# Patient Record
Sex: Male | Born: 1955 | Race: Black or African American | Hispanic: No | Marital: Single | Smoking: Current some day smoker
Health system: Southern US, Community
[De-identification: ages and names within clinical notes are randomized; demographics above are authoritative.]

## PROBLEM LIST (undated history)

## (undated) DIAGNOSIS — I10 Essential (primary) hypertension: Secondary | ICD-10-CM

---

## 2013-11-13 LAB — CK W/ CKMB & INDEX
CK - MB: 3.9 NG/ML — ABNORMAL HIGH (ref 0.5–3.6)
CK-MB Index: 1.4 (ref 0–2.5)
CK: 269 U/L (ref 39–308)

## 2013-11-13 LAB — METABOLIC PANEL, COMPREHENSIVE
A-G Ratio: 0.9 — ABNORMAL LOW (ref 1.1–2.2)
ALT (SGPT): 37 U/L (ref 12–78)
AST (SGOT): 20 U/L (ref 15–37)
Albumin: 3.7 g/dL (ref 3.5–5.0)
Alk. phosphatase: 103 U/L (ref 45–117)
Anion gap: 8 mmol/L (ref 5–15)
BUN/Creatinine ratio: 13 (ref 12–20)
BUN: 19 MG/DL (ref 6–20)
Bilirubin, total: 0.4 MG/DL (ref 0.2–1.0)
CO2: 27 mmol/L (ref 21–32)
Calcium: 8.8 MG/DL (ref 8.5–10.1)
Chloride: 106 mmol/L (ref 97–108)
Creatinine: 1.42 MG/DL — ABNORMAL HIGH (ref 0.45–1.15)
GFR est AA: >60 mL/min/{1.73_m2} (ref >60)
GFR est non-AA: 51 mL/min/{1.73_m2} — ABNORMAL LOW (ref >60)
Globulin: 4 g/dL (ref 2.0–4.0)
Glucose: 97 mg/dL (ref 65–100)
Potassium: 3.9 mmol/L (ref 3.5–5.1)
Protein, total: 7.7 g/dL (ref 6.4–8.2)
Sodium: 141 mmol/L (ref 136–145)

## 2013-11-13 LAB — EKG, 12 LEAD, INITIAL
Atrial Rate: 70 {beats}/min
Calculated P Axis: 49 degrees
Calculated R Axis: -7 degrees
Calculated T Axis: 114 degrees
P-R Interval: 166 ms
Q-T Interval: 412 ms
QRS Duration: 84 ms
QTC Calculation (Bezet): 444 ms
Ventricular Rate: 70 {beats}/min

## 2013-11-13 LAB — CBC WITH AUTOMATED DIFF
ABS. BASOPHILS: 0 10*3/uL (ref 0.0–0.1)
ABS. EOSINOPHILS: 0.3 10*3/uL (ref 0.0–0.4)
ABS. LYMPHOCYTES: 2.1 10*3/uL (ref 0.8–3.5)
ABS. MONOCYTES: 0.6 10*3/uL (ref 0.0–1.0)
ABS. NEUTROPHILS: 4 10*3/uL (ref 1.8–8.0)
BASOPHILS: 1 % (ref 0–1)
EOSINOPHILS: 5 % (ref 0–7)
HCT: 46 % (ref 36.6–50.3)
HGB: 14.9 g/dL (ref 12.1–17.0)
LYMPHOCYTES: 30 % (ref 12–49)
MCH: 27.7 PG (ref 26.0–34.0)
MCHC: 32.4 g/dL (ref 30.0–36.5)
MCV: 85.7 FL (ref 80.0–99.0)
MONOCYTES: 9 % (ref 5–13)
NEUTROPHILS: 55 % (ref 32–75)
PLATELET: 266 10*3/uL (ref 150–400)
RBC: 5.37 M/uL (ref 4.10–5.70)
RDW: 13.5 % (ref 11.5–14.5)
WBC: 7.1 10*3/uL (ref 4.1–11.1)

## 2013-11-13 LAB — TROPONIN I
Troponin-I, Qt.: <0.04 ng/mL (ref <0.05)
Troponin-I, Qt.: <0.04 ng/mL (ref <0.05)

## 2013-11-13 LAB — CK W/ REFLX CKMB: CK: 279 U/L (ref 39–308)

## 2013-11-13 MED ORDER — MORPHINE 2 MG/ML INJECTION
2 mg/mL | INTRAMUSCULAR | Status: AC
Start: 2013-11-13 — End: 2013-11-13
  Administered 2013-11-13: 22:00:00 via INTRAVENOUS

## 2013-11-13 MED ORDER — MUPIROCIN 2 % OINTMENT
2 % | Freq: Two times a day (BID) | CUTANEOUS | Status: AC
Start: 2013-11-13 — End: ?

## 2013-11-13 MED ORDER — ONDANSETRON (PF) 4 MG/2 ML INJECTION
4 mg/2 mL | INTRAMUSCULAR | Status: AC
Start: 2013-11-13 — End: 2013-11-13
  Administered 2013-11-13: 22:00:00 via INTRAVENOUS

## 2013-11-13 MED ORDER — HYDROCODONE-ACETAMINOPHEN 5 MG-325 MG TAB
5-325 mg | ORAL_TABLET | ORAL | Status: AC | PRN
Start: 2013-11-13 — End: ?

## 2013-11-13 MED FILL — MORPHINE 2 MG/ML INJECTION: 2 mg/mL | INTRAMUSCULAR | Qty: 2

## 2013-11-13 MED FILL — ONDANSETRON (PF) 4 MG/2 ML INJECTION: 4 mg/2 mL | INTRAMUSCULAR | Qty: 2

## 2013-11-13 NOTE — ED Provider Notes (Signed)
HPI Comments: Alvin Knight is a 58 y.o. male who presents ambulatory with police to [facility] ED with cc of current 6/10 L sided CP which radiates to his L arm, neck and lower back x 2-3 weeks with minimal intermittent SOB and minimal diaphoresis and additional sxs of cough which is minimally productive of yellow phlegm x 3 weeks, and "burning" rash on his R forearm. Papers note that pt was evaluated in [facility] on 11/07/13 for similar sxs and left AMA. Pt notes that at that time he was told he had an abnormal heart rate and an abnormal EKG. Pt states he is unsure if he burned or scraped his R forearm. Pt notes most recent stress test in 2010 which was normal. Pt denies hx of cardiac cath. Pt denies nausea, fever, leg pain, leg swelling or urinary sxs.    Allergies: NKDA    PCP: Ellwood Sayers    PMhx is significant for: HTN  PShx is significant for: Pt denies   Social hx: + Tobacco (former smoke), - EtOH, - Illicit Drugs    There are no other complaints, changes or physical findings at this time.  Written by Sylvan Cheese, ED Scribe, as dictated by Seward Speck, MD.        The history is provided by the patient. No language interpreter was used.        Past Medical History   Diagnosis Date   ??? Hypertension         History reviewed. No pertinent past surgical history.      History reviewed. No pertinent family history.     History     Social History   ??? Marital Status: SINGLE     Spouse Name: N/A     Number of Children: N/A   ??? Years of Education: N/A     Occupational History   ??? Not on file.     Social History Main Topics   ??? Smoking status: Former Smoker   ??? Smokeless tobacco: Not on file   ??? Alcohol Use: No   ??? Drug Use: No   ??? Sexual Activity: Not on file     Other Topics Concern   ??? Not on file     Social History Narrative   ??? No narrative on file                  ALLERGIES: Review of patient's allergies indicates no known allergies.      Review of Systems   Constitutional: Positive for  diaphoresis (minimal). Negative for fever.   HENT: Negative.    Eyes: Negative.    Respiratory: Positive for cough and shortness of breath (minimal, occasional).    Cardiovascular: Positive for chest pain (radiating to neck, L arm and lower back). Negative for leg swelling.   Gastrointestinal: Negative for nausea.   Endocrine: Negative for heat intolerance.   Genitourinary:        No urinary sxs   Musculoskeletal: Negative for arthralgias (no leg pain).   Skin: Positive for rash (R forearm).   Allergic/Immunologic: Negative for immunocompromised state.   Hematological: Does not bruise/bleed easily.   Psychiatric/Behavioral: Negative.    All other systems reviewed and are negative.      Filed Vitals:    11/13/13 1425 11/13/13 1730 11/13/13 1800   BP: 137/87 134/73 151/78   Pulse: 69     Temp: 98.6 ??F (37 ??C)     Resp: _0 Height:  5' 8" (1.727 m)     Weight: 113.399 kg (250 lb)     SpO2: 98% 95% 95%            Physical Exam   Constitutional: He is oriented to person, place, and time. He appears well-developed. He appears distressed (mild).   Obese.   HENT:   Head: Normocephalic and atraumatic.   Neck: Normal range of motion.   Lower cervical tenderness.   Cardiovascular: Normal rate and regular rhythm.    Pulmonary/Chest: Effort normal and breath sounds normal. He exhibits tenderness (some L sided chest wall tenderness).   Abdominal: Soft. Bowel sounds are normal. There is no tenderness.   Musculoskeletal: He exhibits no edema (in legs) or tenderness (in legs).   L flank tenderness.   Neurological: He is alert and oriented to person, place, and time. Coordination normal.   Skin: Skin is warm and dry. Rash (impetigo-like rash on R forearm, mildly tender) noted.   Psychiatric: He has a normal mood and affect.   Nursing note and vitals reviewed.  Written by Sylvan Cheese, ED Scribe, as dictated by Seward Speck, MD.    MDM  Number of Diagnoses or Management Options  Chest pain:   Impetigo:   Diagnosis management  comments: DDx: CAD, costochondritis, musculoskeletal pain, reflux, gastritis, dermatitis, impetigo        Amount and/or Complexity of Data Reviewed  Clinical lab tests: ordered and reviewed  Tests in the radiology section of CPT??: ordered and reviewed  Tests in the medicine section of CPT??: ordered and reviewed  Review and summarize past medical records: yes  Independent visualization of images, tracings, or specimens: yes    Patient Progress  Patient progress: stable      Procedures    EKG interpretation: (Preliminary)  14:25  Rhythm: sinus rhythm with marked sinus arrhythmia. Rate (approx.): 70 bpm; Axis: normal; P wave: possible L atrial enlargement; QRS interval: normal ; ST/T wave: T wave abnormality, consider lateral ischemia; Other findings: left ventricular hypertrophy, possible ischemia.  Written by Sylvan Cheese, ED Scribe, as dictated by Seward Speck, MD.    7:20 PM  Pt reevaluated. Pt denies any SOB and denies any SOB with deep inspiration.  Written by Sylvan Cheese, ED Scribe, as dictated by Seward Speck, MD.    LABORATORY TESTS:  Recent Results (from the past 12 hour(s))   EKG, 12 LEAD, INITIAL    Collection Time     11/13/13  2:25 PM       Result Value Ref Range    Ventricular Rate 70      Atrial Rate 70      P-R Interval 166      QRS Duration 84      Q-T Interval 412      QTC Calculation (Bezet) 444      Calculated P Axis 49      Calculated R Axis -7      Calculated T Axis 114      Diagnosis        Value: Sinus rhythm with marked sinus arrhythmia  Possible Left atrial enlargement  Left ventricular hypertrophy  Nonspecific ST and T wave abnormality    Abnormal ECG  No previous ECGs available  Confirmed by Comer Locket [id number]) on 11/13/2013 6:58:43 PM     CBC WITH AUTOMATED DIFF    Collection Time     11/13/13  2:30 PM       Result Value Ref Range    WBC  7.1  4.1 - 11.1 K/uL    RBC 5.37  4.10 - 5.70 M/uL    HGB 14.9  12.1 - 17.0 g/dL    HCT 46.0  36.6 - 50.3 %    MCV 85.7  80.0 - 99.0 FL     MCH 27.7  26.0 - 34.0 PG    MCHC 32.4  30.0 - 36.5 g/dL    RDW 13.5  11.5 - 14.5 %    PLATELET 266  150 - 400 K/uL    NEUTROPHILS 55  32 - 75 %    LYMPHOCYTES 30  12 - 49 %    MONOCYTES 9  5 - 13 %    EOSINOPHILS 5  0 - 7 %    BASOPHILS 1  0 - 1 %    ABS. NEUTROPHILS 4.0  1.8 - 8.0 K/UL    ABS. LYMPHOCYTES 2.1  0.8 - 3.5 K/UL    ABS. MONOCYTES 0.6  0.0 - 1.0 K/UL    ABS. EOSINOPHILS 0.3  0.0 - 0.4 K/UL    ABS. BASOPHILS 0.0  0.0 - 0.1 K/UL   METABOLIC PANEL, COMPREHENSIVE    Collection Time     11/13/13  2:30 PM       Result Value Ref Range    Sodium 141  136 - 145 mmol/L    Potassium 3.9  3.5 - 5.1 mmol/L    Chloride 106  97 - 108 mmol/L    CO2 27  21 - 32 mmol/L    Anion gap 8  5 - 15 mmol/L    Glucose 97  65 - 100 mg/dL    BUN 19  6 - 20 MG/DL    Creatinine 1.42 (*) 0.45 - 1.15 MG/DL    BUN/Creatinine ratio 13  12 - 20      GFR est AA >60  >60 ml/min/1.36m    GFR est non-AA 51 (*) >60 ml/min/1.732m   Calcium 8.8  8.5 - 10.1 MG/DL    Bilirubin, total 0.4  0.2 - 1.0 MG/DL    ALT 37  12 - 78 U/L    AST 20  15 - 37 U/L    Alk. phosphatase 103  45 - 117 U/L    Protein, total 7.7  6.4 - 8.2 g/dL    Albumin 3.7  3.5 - 5.0 g/dL    Globulin 4.0  2.0 - 4.0 g/dL    A-G Ratio 0.9 (*) 1.1 - 2.2     TROPONIN I    Collection Time     11/13/13  2:30 PM       Result Value Ref Range    Troponin-I, Qt. <0.04  <0.05 ng/mL   CK W/ REFLX CKMB    Collection Time     11/13/13  2:30 PM       Result Value Ref Range    CK 279  39 - 308 U/L   CK W/ CKMB & INDEX    Collection Time     11/13/13  6:16 PM       Result Value Ref Range    CK 269  39 - 308 U/L    CK - MB 3.9 (*) 0.5 - 3.6 NG/ML    CK-MB Index 1.4  0 - 2.5     TROPONIN I    Collection Time     11/13/13  6:16 PM       Result Value Ref Range    Troponin-I, Qt. <0.04  <  0.05 ng/mL       IMAGING RESULTS:     XR CHEST PA LAT (Final result) Result time: 11/13/13 15:51:59    Final result by Rad Results In Edi (11/13/13 15:51:59)    Narrative:    **Final Report**      ICD Codes /  Adm.Diagnosis: 407680 / Chest Pain Chest Pain  Examination: CR CHEST PA AND LATERAL - [id number] - Nov 13 2013 3:48PM  Accession No: [id number]  Reason: Chest Pain      REPORT:  Clinical indication: Chest pain.    Frontal and lateral views of the chest obtained. The heart size is normal.   There is no acute infiltrate.      IMPRESSION: Negative.          Signing/Reading Doctor: Lannette Donath [phone])   Approved: Lannette Donath [phone]) Nov 13 2013 3:49PM                   XR CHEST PA LAT (Final result) Result time: 11/13/13 14:53:04    Final result by Rad Results In Edi (11/13/13 14:53:04)    Narrative:    **Final Report**      ICD Codes / Adm.Diagnosis: 711657 / Chest Pain Chest Pain  Examination: CR CHEST PA AND LATERAL - [id number] - Nov 13 2013 2:48PM  Accession No: [id number]  Reason: chest pain      REPORT:  Indication: Chest Pain     Exam: PA and lateral views of the chest.    There is no prior study for direct comparison.    Findings: Cardiomediastinal silhouette is within normal limits. Lungs are   clear bilaterally. Pleural spaces are normal. Osseous structures are intact.      IMPRESSION: No acute cardiopulmonary disease.          Signing/Reading Doctor: TODD B. BAIRD [phone])   Approved: TODD B. BAIRD [phone]) Nov 13 2013 2:50PM                MEDICATIONS GIVEN:  Medications   morphine injection 4 mg (4 mg IntraVENous Given 11/13/13 1812)   ondansetron (ZOFRAN) injection 4 mg (4 mg IntraVENous Given 11/13/13 1812)       IMPRESSION:  1. Chest pain    2. Impetigo        PLAN:  1. See a cardiologist for further evaluation.  2. FU with PCP in 2 days as needed.  3. Rx'd Bactroban and Norco  Return to ED if worse     Discharge Note:  7:21 PM  Pts results have been reviewed with them. Pt and/or family verbally conveys agreement and understanding of pt's signs, sxs, tx, dx, and prognosis and agrees to FU as recommended with PCP in 2 days as needed, to see a cardiologist for further evaluation or return to the ED should sxs  change prior to FU appointment. Pt and/or family verbally agrees with care plan and verbally conveys that all of their questions have been answered. Pt was discharged with prescriptions for Bactroban and Norco.  Written by Sylvan Cheese, ED Scribe, as dictated by Seward Speck, MD.

## 2013-11-13 NOTE — ED Notes (Signed)
Skin: Pt has rash to R forearm at this time. Area is oozing serous fluid.

## 2013-11-13 NOTE — ED Notes (Signed)
[place] [facility] [place] [facility] [place] hand and foot cuffs.  Pt placed in hallway outside of triage at this time.

## 2013-11-13 NOTE — ED Notes (Signed)
[place] [employer]. Pt's ankles are shackled.     Assumed care of pt. Pt states he was seen in [place] for chest pain on 3/4 and left AMA. Pt then reported to court on 3/5 and was sent to jail. Pt states chest pain ever since the 4th of March. Pt denies nausea, vomiting, and SOB. Pt is on monitor X2 and call bell is within reach.

## 2014-06-05 ENCOUNTER — Emergency Department: Payer: Self-pay | Admitting: Internal Medicine

## 2014-06-05 LAB — URINALYSIS, COMPLETE
BACTERIA: NONE SEEN
BLOOD: NEGATIVE
Bilirubin,UR: NEGATIVE
GLUCOSE, UR: NEGATIVE mg/dL (ref 0–75)
KETONE: NEGATIVE
Leukocyte Esterase: NEGATIVE
Nitrite: NEGATIVE
Ph: 5 (ref 4.5–8.0)
Protein: NEGATIVE
RBC,UR: NONE SEEN /HPF (ref 0–5)
Specific Gravity: 1.019 (ref 1.003–1.030)
Squamous Epithelial: <1
WBC UR: 1 /HPF (ref 0–5)

## 2014-06-05 LAB — CBC
HCT: 39.5 % — ABNORMAL LOW (ref 40.0–52.0)
HGB: 12.7 g/dL — AB (ref 13.0–18.0)
MCH: 28.5 pg (ref 26.0–34.0)
MCHC: 32.1 g/dL (ref 32.0–36.0)
MCV: 89 fL (ref 80–100)
Platelet: 203 10*3/uL (ref 150–440)
RBC: 4.45 10*6/uL (ref 4.40–5.90)
RDW: 14 % (ref 11.5–14.5)
WBC: 7.2 10*3/uL (ref 3.8–10.6)

## 2014-06-05 LAB — COMPREHENSIVE METABOLIC PANEL
ALK PHOS: 79 U/L
ALT: 33 U/L
Albumin: 3.4 g/dL (ref 3.4–5.0)
Anion Gap: 5 — ABNORMAL LOW (ref 7–16)
BUN: 14 mg/dL (ref 7–18)
Bilirubin,Total: 0.2 mg/dL (ref 0.2–1.0)
CHLORIDE: 108 mmol/L — AB (ref 98–107)
CREATININE: 1.37 mg/dL — AB (ref 0.60–1.30)
Calcium, Total: 8.1 mg/dL — ABNORMAL LOW (ref 8.5–10.1)
Co2: 29 mmol/L (ref 21–32)
EGFR (African American): >60
EGFR (Non-African Amer.): 57 — ABNORMAL LOW
Glucose: 82 mg/dL (ref 65–99)
OSMOLALITY: 283 (ref 275–301)
Potassium: 3.8 mmol/L (ref 3.5–5.1)
SGOT(AST): 25 U/L (ref 15–37)
Sodium: 142 mmol/L (ref 136–145)
Total Protein: 6.9 g/dL (ref 6.4–8.2)

## 2014-06-05 LAB — DRUG SCREEN, URINE
AMPHETAMINES, UR SCREEN: NEGATIVE (ref ?–1000)
BARBITURATES, UR SCREEN: NEGATIVE (ref ?–200)
BENZODIAZEPINE, UR SCRN: NEGATIVE (ref ?–200)
CANNABINOID 50 NG, UR ~~LOC~~: NEGATIVE (ref ?–50)
Cocaine Metabolite,Ur ~~LOC~~: NEGATIVE (ref ?–300)
MDMA (ECSTASY) UR SCREEN: NEGATIVE (ref ?–500)
Methadone, Ur Screen: NEGATIVE (ref ?–300)
Opiate, Ur Screen: NEGATIVE (ref ?–300)
Phencyclidine (PCP) Ur S: NEGATIVE (ref ?–25)
Tricyclic, Ur Screen: NEGATIVE (ref ?–1000)

## 2014-06-05 LAB — TROPONIN I
Troponin-I: <0.02 ng/mL
Troponin-I: <0.02 ng/mL

## 2014-10-27 ENCOUNTER — Emergency Department: Payer: Self-pay | Admitting: Emergency Medicine

## 2015-04-30 ENCOUNTER — Emergency Department
Admission: EM | Admit: 2015-04-30 | Discharge: 2015-04-30 | Disposition: A | Discharge status: Home / Self Care | Payer: Self-pay | Attending: Emergency Medicine | Admitting: Emergency Medicine

## 2015-04-30 ENCOUNTER — Encounter: Payer: Self-pay | Admitting: Emergency Medicine

## 2015-04-30 DIAGNOSIS — M545 Low back pain, unspecified: Secondary | ICD-10-CM

## 2015-04-30 DIAGNOSIS — Z720 Tobacco use: Secondary | ICD-10-CM | POA: Insufficient documentation

## 2015-04-30 DIAGNOSIS — I10 Essential (primary) hypertension: Secondary | ICD-10-CM | POA: Insufficient documentation

## 2015-04-30 HISTORY — DX: Essential (primary) hypertension: I10

## 2015-04-30 LAB — CBC WITH DIFFERENTIAL/PLATELET
Basophils Absolute: 0.1 10*3/uL (ref 0–0.1)
Basophils Relative: 1 %
Eosinophils Absolute: 0.2 10*3/uL (ref 0–0.7)
Eosinophils Relative: 4 %
HEMATOCRIT: 41.4 % (ref 40.0–52.0)
Hemoglobin: 13.5 g/dL (ref 13.0–18.0)
Lymphocytes Relative: 29 %
Lymphs Abs: 1.6 10*3/uL (ref 1.0–3.6)
MCH: 27.8 pg (ref 26.0–34.0)
MCHC: 32.6 g/dL (ref 32.0–36.0)
MCV: 85.3 fL (ref 80.0–100.0)
MONO ABS: 0.5 10*3/uL (ref 0.2–1.0)
MONOS PCT: 10 %
NEUTROS ABS: 3.2 10*3/uL (ref 1.4–6.5)
Neutrophils Relative %: 56 %
Platelets: 196 10*3/uL (ref 150–440)
RBC: 4.85 MIL/uL (ref 4.40–5.90)
RDW: 14.6 % — AB (ref 11.5–14.5)
WBC: 5.7 10*3/uL (ref 3.8–10.6)

## 2015-04-30 LAB — COMPREHENSIVE METABOLIC PANEL
ALBUMIN: 3.7 g/dL (ref 3.5–5.0)
ALT: 33 U/L (ref 17–63)
AST: 32 U/L (ref 15–41)
Alkaline Phosphatase: 72 U/L (ref 38–126)
Anion gap: 5 (ref 5–15)
BUN: 14 mg/dL (ref 6–20)
CO2: 29 mmol/L (ref 22–32)
CREATININE: 1.39 mg/dL — AB (ref 0.61–1.24)
Calcium: 8.7 mg/dL — ABNORMAL LOW (ref 8.9–10.3)
Chloride: 107 mmol/L (ref 101–111)
GFR calc Af Amer: >60 mL/min (ref >60)
GFR calc non Af Amer: 54 mL/min — ABNORMAL LOW (ref >60)
GLUCOSE: 99 mg/dL (ref 65–99)
Potassium: 3.7 mmol/L (ref 3.5–5.1)
SODIUM: 141 mmol/L (ref 135–145)
Total Bilirubin: 0.1 mg/dL — ABNORMAL LOW (ref 0.3–1.2)
Total Protein: 6.9 g/dL (ref 6.5–8.1)

## 2015-04-30 MED ORDER — HYDROCODONE-ACETAMINOPHEN 5-325 MG PO TABS: 1.0000 | ORAL_TABLET | ORAL | Status: AC | PRN

## 2015-04-30 MED ORDER — HYDROCHLOROTHIAZIDE 25 MG PO TABS: 25.0000 mg | ORAL_TABLET | Freq: Every day | ORAL | Status: AC

## 2015-04-30 MED ORDER — NAPROXEN 500 MG PO TABS: 500.0000 mg | ORAL_TABLET | Freq: Two times a day (BID) | ORAL | Status: AC

## 2015-04-30 MED ORDER — ATENOLOL 25 MG PO TABS: 25.0000 mg | ORAL_TABLET | Freq: Every day | ORAL | Status: AC

## 2015-04-30 NOTE — ED Provider Notes (Signed)
[facility] Emergency Department Provider Note  ____________________________________________  Time seen: Approximately 7:37 AM  I have reviewed the triage vital signs and the nursing notes.   HISTORY  Chief Complaint Hypertension   HPI Julian Robinson is a 59 y.o. male is here with complaint of low back pain for several days. He is also out of his blood pressure medicine. He states that he did not have an injury to his back and denies any previous back problems. States the pain is mostly on his left side, nonradiating. He denies any urinary symptoms. He states he also ran out of his blood pressure medicine approximately 2 weeks ago but is very evasive about the exact time. He states he takes atenolol 25 mg and hydrochlorothiazide 25 mg. He states he is not have a PCP and comes to the emergency room file his blood pressure refills. He states he did see a doctor in [place], [place] a couple of months ago but did not get the prescription.Currently his back pain is 8 out of 10. He denies any headache, vision changes, shortness of breath or chest pain.   Past Medical History  Diagnosis Date  . Hypertension     There are no active problems to display for this patient.   History reviewed. No pertinent past surgical history.  Current Outpatient Rx  Name  Route  Sig  Dispense  Refill  . atenolol (TENORMIN) 25 MG tablet   Oral   Take 1 tablet (25 mg total) by mouth daily.   30 tablet   11   . hydrochlorothiazide (HYDRODIURIL) 25 MG tablet   Oral   Take 1 tablet (25 mg total) by mouth daily.   30 tablet   0   . HYDROcodone-acetaminophen (NORCO/VICODIN) 5-325 MG per tablet   Oral   Take 1 tablet by mouth every 4 (four) hours as needed for moderate pain.   20 tablet   0   . naproxen (NAPROSYN) 500 MG tablet   Oral   Take 1 tablet (500 mg total) by mouth 2 (two) times daily with a meal.   30 tablet   0     Allergies Review of patient's  allergies indicates no known allergies.  History reviewed. No pertinent family history.  Social History Social History  Substance Use Topics  . Smoking status: Current Some Day Smoker  . Smokeless tobacco: None  . Alcohol Use: No    Review of Systems Constitutional: No fever/chills Eyes: No visual changes. ENT: No sore throat. Cardiovascular: Denies chest pain. Respiratory: Denies shortness of breath. Gastrointestinal: No abdominal pain.  No nausea, no vomiting. Genitourinary: Negative for dysuria. Musculoskeletal: Positive for back pain. Skin: Negative for rash. Neurological: Negative for headaches, focal weakness or numbness.  10-point ROS otherwise negative.  ____________________________________________   PHYSICAL EXAM:  VITAL SIGNS: ED Triage Vitals  Enc Vitals Group     BP 04/30/15 0716 171/98 mmHg     Pulse Rate 04/30/15 0716 71     Resp 04/30/15 0716 18     Temp 04/30/15 0716 97.7 F (36.5 C)     Temp Source 04/30/15 0716 Oral     SpO2 04/30/15 0716 98 %     Weight 04/30/15 0716 250 lb (113.399 kg)     Height 04/30/15 0716  (1.727 m)     Head Cir --      Peak Flow --      Pain Score 04/30/15 0715 8     Pain  Loc --      Pain Edu? --      Excl. in GC? --     Constitutional: Alert and oriented. Well appearing and in no acute distress. Eyes: Conjunctivae are normal. PERRL. EOMI. Head: Atraumatic. Nose: No congestion/rhinnorhea. Mouth/Throat: Mucous membranes are moist.  Oropharynx non-erythematous. Neck: No stridor.   Cardiovascular: Normal rate, regular rhythm. Grossly normal heart sounds.  Good peripheral circulation. Respiratory: Normal respiratory effort.  No retractions. Lungs CTAB. Gastrointestinal: Soft and nontender. No distention. No CVA tenderness. Musculoskeletal: Examination of the back. No gross deformity was noted. There is no tenderness on palpation of the LS spine. There is tenderness left lateral & muscle area. No difficulty with  range of motion. No active muscle spasms. Straight leg raises were negative. No lower extremity tenderness nor edema.  No joint effusions. Neurologic:  Normal speech and language. No gross focal neurologic deficits are appreciated. No gait instability. Reflexes 1+ bilaterally Skin:  Skin is warm, dry and intact. No rash noted. Psychiatric: Mood and affect are normal. Speech and behavior are normal.  ____________________________________________   LABS (all labs ordered are listed, but only abnormal results are displayed)  Labs Reviewed  COMPREHENSIVE METABOLIC PANEL - Abnormal; Notable for the following:    Creatinine, Ser 1.39 (*)    Calcium 8.7 (*)    Total Bilirubin 0.1 (*)    GFR calc non Af Amer 54 (*)    All other components within normal limits  CBC WITH DIFFERENTIAL/PLATELET - Abnormal; Notable for the following:    RDW 14.6 (*)    All other components within normal limits   ____________________________________________  PROCEDURES  Procedure(s) performed: None  Critical Care performed: No  ____________________________________________   INITIAL IMPRESSION / ASSESSMENT AND PLAN / ED COURSE  Pertinent labs & imaging results that were available during my care of the patient were reviewed by me and considered in my medical decision making (see chart for details).  Patient was instructed to get a PCP as soon as possible. He was given a list of clinics in this area that most fit his needs. He and his wife are aware of his creatinine being elevated at this time. We discussed the dangers of being on blood pressure medicine intermittently and not well controlled. Patient was started on Naprosyn 500 mg twice a day for his back pain. Norco for severe pain. Atenolol 25 mg one daily and hydrochlorothiazide 25 mg one daily. ____________________________________________   FINAL CLINICAL IMPRESSION(S) / ED DIAGNOSES  Final diagnoses:  Left-sided low back pain without sciatica   Essential hypertension      Tommi Rumps, PA-C 04/30/15 1019  Emily Filbert, MD 04/30/15 1525

## 2015-04-30 NOTE — Discharge Instructions (Signed)
CALL OFFICES LISTED ON YOUR PAPERS FOR APPOINTMENT TO CONTROL YOUR BLOOD PRESSURE

## 2015-04-30 NOTE — ED Notes (Signed)
States he developed lower back pain several days ago w/o injury. Ambulates well... States pain is non radiating. Also has been out of his b/p meds . Atenolol 25 mg and HCTZ   Last time he took them was about 2 weeks ago

## 2015-04-30 NOTE — ED Notes (Signed)
Reports lower back pain x 1 wk.  States he also ran out of his bp meds

## 2015-05-25 ENCOUNTER — Encounter: Payer: Self-pay | Admitting: Emergency Medicine

## 2015-05-25 ENCOUNTER — Emergency Department
Admission: EM | Admit: 2015-05-25 | Discharge: 2015-05-25 | Disposition: A | Discharge status: Home / Self Care | Payer: Self-pay | Attending: Emergency Medicine | Admitting: Emergency Medicine

## 2015-05-25 DIAGNOSIS — I10 Essential (primary) hypertension: Secondary | ICD-10-CM | POA: Insufficient documentation

## 2015-05-25 DIAGNOSIS — Z791 Long term (current) use of non-steroidal anti-inflammatories (NSAID): Secondary | ICD-10-CM | POA: Insufficient documentation

## 2015-05-25 DIAGNOSIS — Z760 Encounter for issue of repeat prescription: Secondary | ICD-10-CM | POA: Insufficient documentation

## 2015-05-25 DIAGNOSIS — Z720 Tobacco use: Secondary | ICD-10-CM | POA: Insufficient documentation

## 2015-05-25 DIAGNOSIS — Z79899 Other long term (current) drug therapy: Secondary | ICD-10-CM | POA: Insufficient documentation

## 2015-05-25 MED ORDER — HYDROCHLOROTHIAZIDE 12.5 MG PO TABS: 25.0000 mg | ORAL_TABLET | Freq: Every day | ORAL | Status: AC

## 2015-05-25 NOTE — Discharge Instructions (Signed)
Medication Refill, Emergency Department °We have refilled your medication today as a courtesy to you. It is best for your medical care, however, to take care of getting refills done through your primary caregiver's office. They have your records and can do a better job of follow-up than we can in the emergency department. °On maintenance medications, we often only prescribe enough medications to get you by until you are able to see your regular caregiver. This is a more expensive way to refill medications. °In the future, please plan for refills so that you will not have to use the emergency department for this. °Thank you for your help. Your help allows us to better take care of the daily emergencies that enter our department. °Document Released: 12/11/2003 Document Revised: 11/16/2011 Document Reviewed: 12/01/2013 °ExitCare® Patient Information ©2015 [employer]. This information is not intended to replace advice given to you by your health care provider. Make sure you discuss any questions you have with your health care provider. ° °

## 2015-05-25 NOTE — ED Notes (Signed)
States he needs b/p meds refilled  Has appt on the 28 th of this month.  Denies any sx's

## 2015-05-25 NOTE — ED Notes (Addendum)
Pt denies any complaints states he needs a medication refill of his blood pressure medication. Can not see PCP until the 28th of this month and only has 3 pills left. Mediations listed in chart

## 2015-05-25 NOTE — ED Provider Notes (Signed)
[facility] Emergency Department Provider Note  ____________________________________________  Time seen: Approximately 12:28 PM  I have reviewed the triage vital signs and the nursing notes.   HISTORY  Chief Complaint Medication Reaction    HPI Julian Robinson is a 59 y.o. male for refill of his hydrochlorothiazide. Patient state he is scheduled to see a new PCP on 06/05/19 16. Patient only has 3 tablets left from his previous prescription which was written on a 04/30/2015. Patient denies any headache or vision disturbance or vertigo.   Past Medical History  Diagnosis Date  . Hypertension     There are no active problems to display for this patient.   History reviewed. No pertinent past surgical history.  Current Outpatient Rx  Name  Route  Sig  Dispense  Refill  . atenolol (TENORMIN) 25 MG tablet   Oral   Take 1 tablet (25 mg total) by mouth daily.   30 tablet   11   . hydrochlorothiazide (HYDRODIURIL) 25 MG tablet   Oral   Take 1 tablet (25 mg total) by mouth daily.   30 tablet   0   . HYDROcodone-acetaminophen (NORCO/VICODIN) 5-325 MG per tablet   Oral   Take 1 tablet by mouth every 4 (four) hours as needed for moderate pain.   20 tablet   0   . naproxen (NAPROSYN) 500 MG tablet   Oral   Take 1 tablet (500 mg total) by mouth 2 (two) times daily with a meal.   30 tablet   0     Allergies Review of patient's allergies indicates no known allergies.  No family history on file.  Social History Social History  Substance Use Topics  . Smoking status: Current Some Day Smoker  . Smokeless tobacco: None  . Alcohol Use: No    Review of Systems Constitutional: No fever/chills Eyes: No visual changes. ENT: No sore throat. Cardiovascular: Denies chest pain. Respiratory: Denies shortness of breath. Gastrointestinal: No abdominal pain.  No nausea, no vomiting.  No diarrhea.  No constipation. Genitourinary: Negative for  dysuria. Musculoskeletal: Negative for back pain. Skin: Negative for rash. Neurological: Negative for headaches, focal weakness or numbness. Endocrine:Hypertension ___________________________________________   PHYSICAL EXAM:  VITAL SIGNS: ED Triage Vitals  Enc Vitals Group     BP 05/25/15 1146 138/69 mmHg     Pulse Rate 05/25/15 1146 63     Resp 05/25/15 1146 18     Temp 05/25/15 1146 97.7 F (36.5 C)     Temp Source 05/25/15 1146 Oral     SpO2 05/25/15 1146 96 %     Weight 05/25/15 1146 256 lb (116.121 kg)     Height 05/25/15 1146  (1.727 m)     Head Cir --      Peak Flow --      Pain Score --      Pain Loc --      Pain Edu? --      Excl. in GC? --     Constitutional: Alert and oriented. Well appearing and in no acute distress. Eyes: Conjunctivae are normal. PERRL. EOMI. Head: Atraumatic. Nose: No congestion/rhinnorhea. Mouth/Throat: Mucous membranes are moist.  Oropharynx non-erythematous. Neck: No stridor.   Hematological/Lymphatic/Immunilogical: No cervical lymphadenopathy. Cardiovascular: Normal rate, regular rhythm. Grossly normal heart sounds.  Good peripheral circulation. Respiratory: Normal respiratory effort.  No retractions. Lungs CTAB. Gastrointestinal: Soft and nontender. No distention. No abdominal bruits. No CVA tenderness. Musculoskeletal: No lower extremity tenderness nor edema.  No joint effusions. Neurologic:  Normal speech and language. No gross focal neurologic deficits are appreciated. No gait instability. Skin:  Skin is warm, dry and intact. No rash noted. Psychiatric: Mood and affect are normal. Speech and behavior are normal.  ____________________________________________   LABS (all labs ordered are listed, but only abnormal results are displayed)  Labs Reviewed - No data to  display ____________________________________________  EKG   ____________________________________________  RADIOLOGY   ____________________________________________   PROCEDURES  Procedure(s) performed: None  Critical Care performed: No  ____________________________________________   INITIAL IMPRESSION / ASSESSMENT AND PLAN / ED COURSE  Pertinent labs & imaging results that were available during my care of the patient were reviewed by me and considered in my medical decision making (see chart for details).  Medication refill. Patient advised to follow-up with scheduled appointment with new PCP.  ____________________________________________   FINAL CLINICAL IMPRESSION(S) / ED DIAGNOSES  Final diagnoses:  Encounter for medication refill      Joni Reining, PA-C 05/25/15 1238  Darien Ramus, MD 05/25/15 1415

## 2015-10-28 IMAGING — CT CT HEAD WITHOUT CONTRAST
1 series · 16 of 30 positions shown, 20 images · non-contrast
Comparison: None.

CLINICAL DATA: Blurred vision with elevated blood pressure for 2 or
3 days. Intermittent left chest pain.

EXAM:
CT HEAD WITHOUT CONTRAST
TECHNIQUE: Contiguous axial images were obtained from the base of the skull
through the vertex without intravenous contrast.

[Series 2: head wo · axial · 0.42mm/px · z∈[-144,-0]mm · 16 of 36 slices shown, 20 images]
[im 2/36  brain]
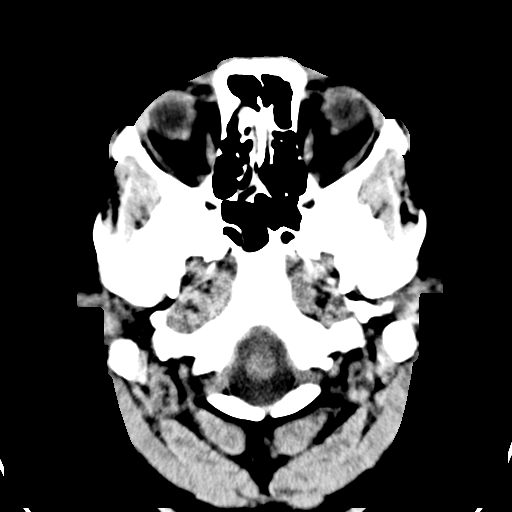
[im 2/36  bone]
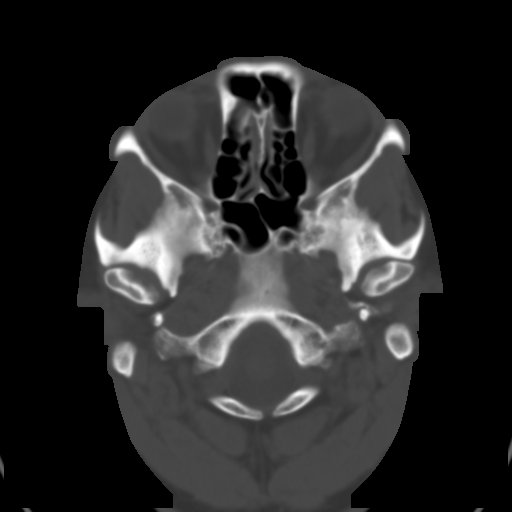
[im 4/36  brain]
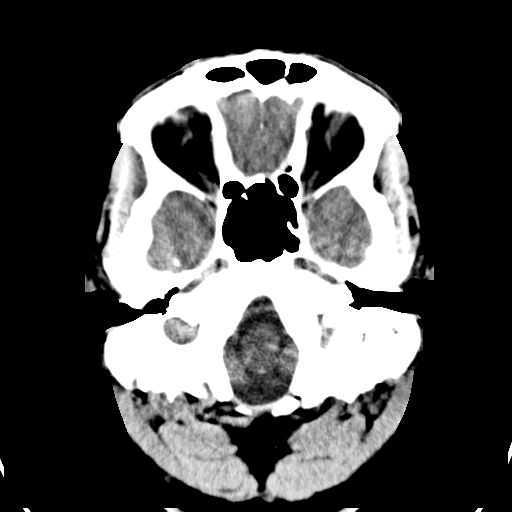
[im 7/36  brain]
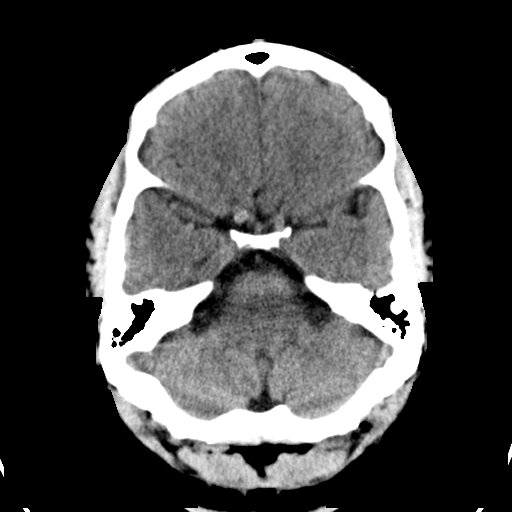
[im 9/36  brain]
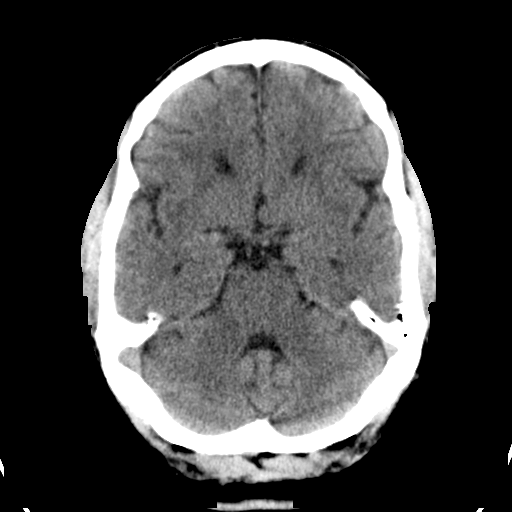
[im 10/36  brain]
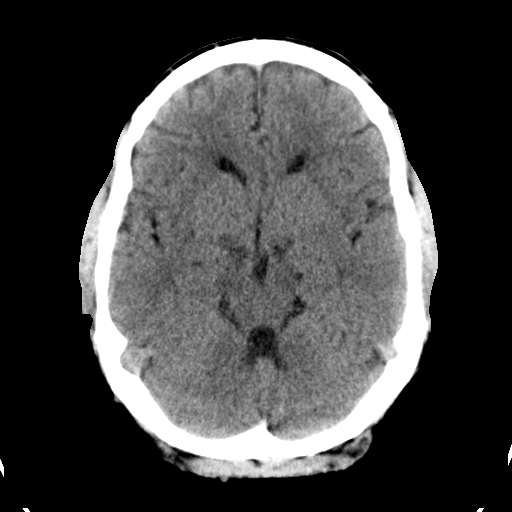
[im 10/36  bone]
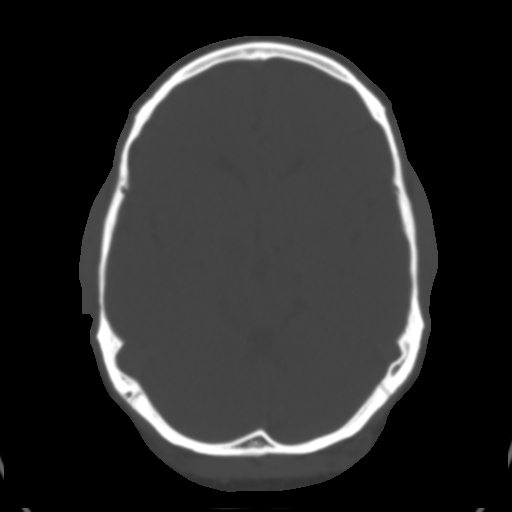
[im 13/36  brain]
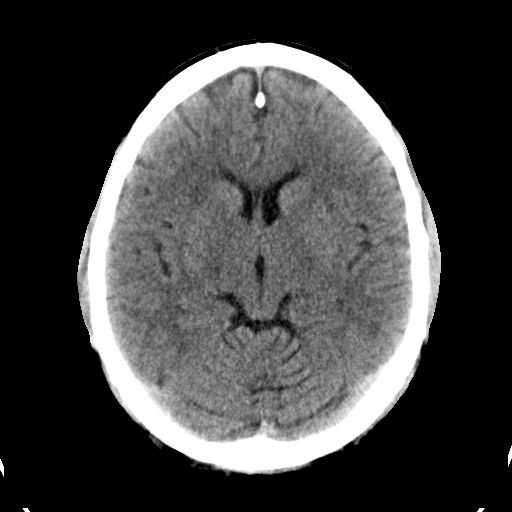
[im 15/36  brain]
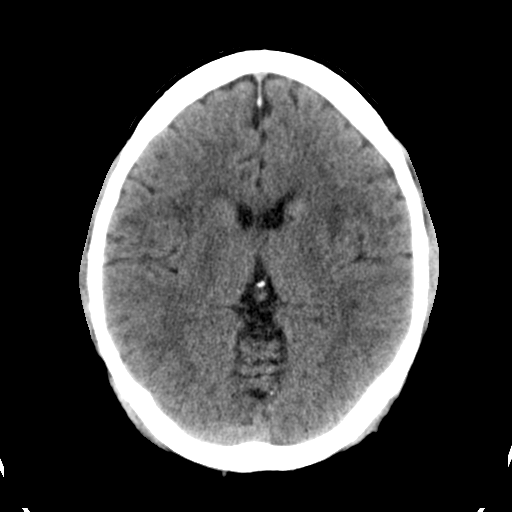
[im 17/36  brain]
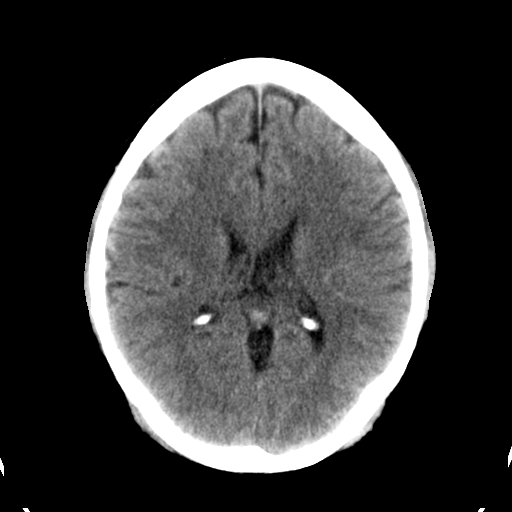
[im 19/36  brain]
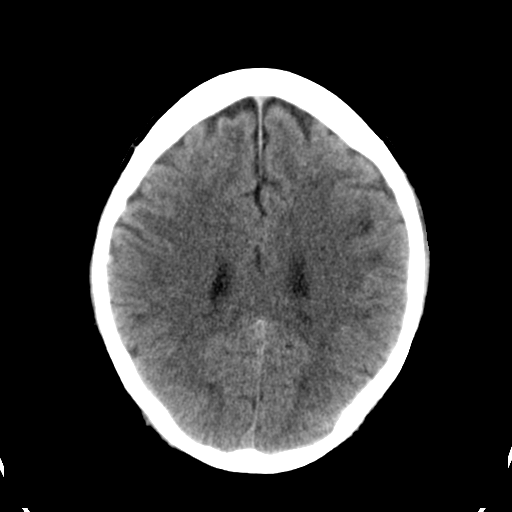
[im 19/36  bone]
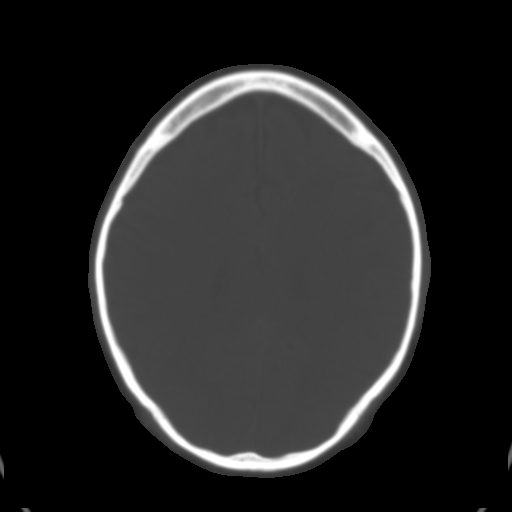
[im 21/36  brain]
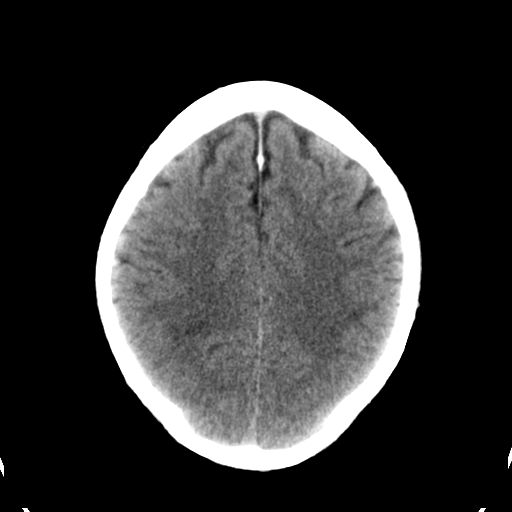
[im 23/36  brain]
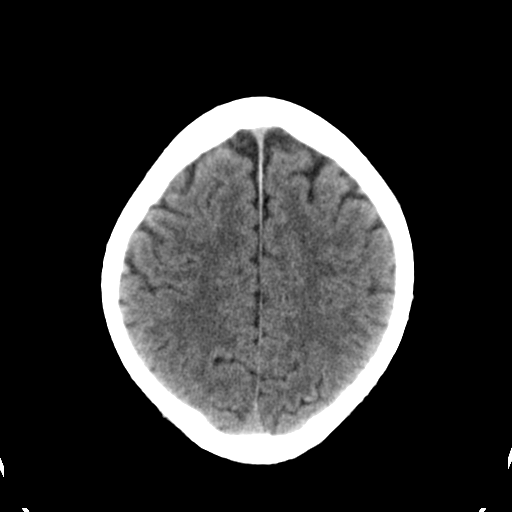
[im 26/36  brain]
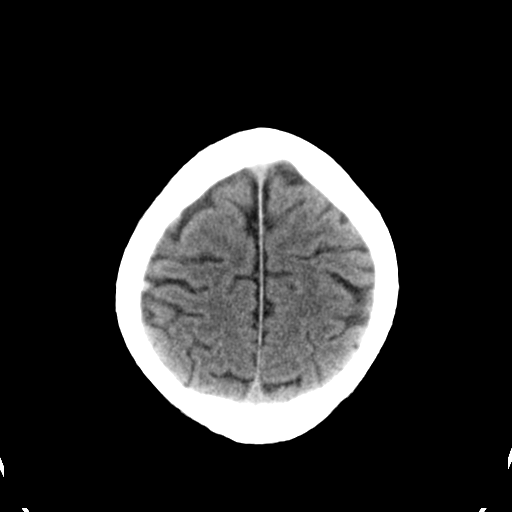
[im 27/36  brain]
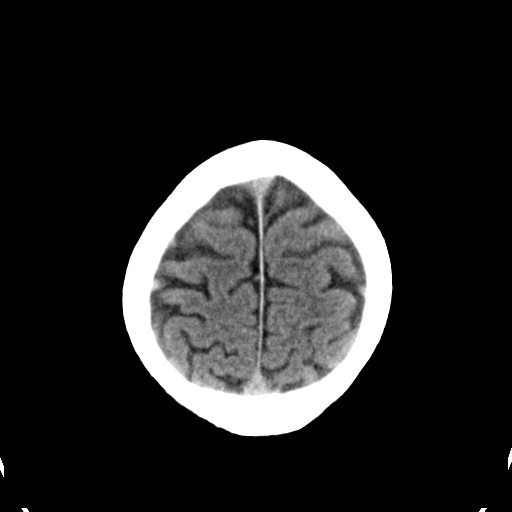
[im 27/36  bone]
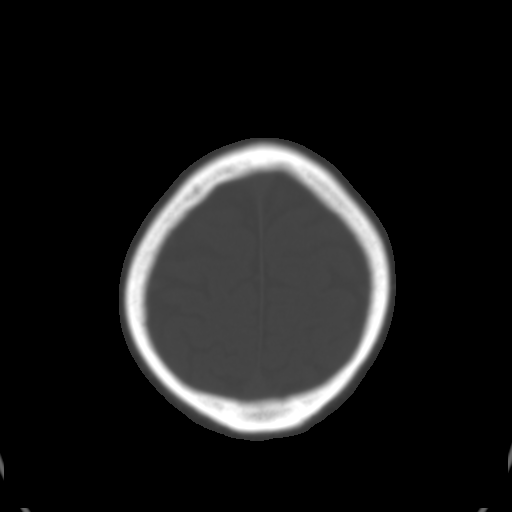
[im 29/36  brain]
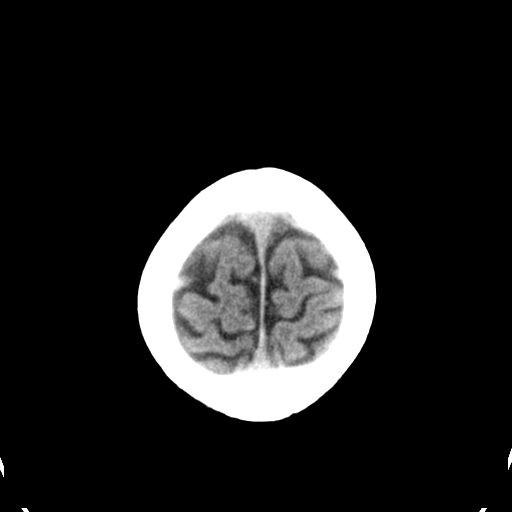
[im 32/36  brain]
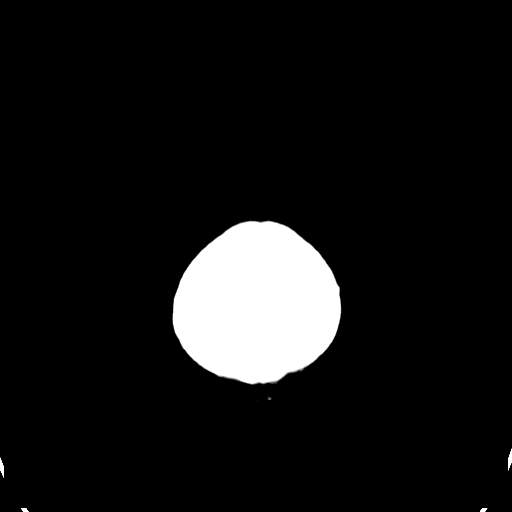
[im 34/36  brain]
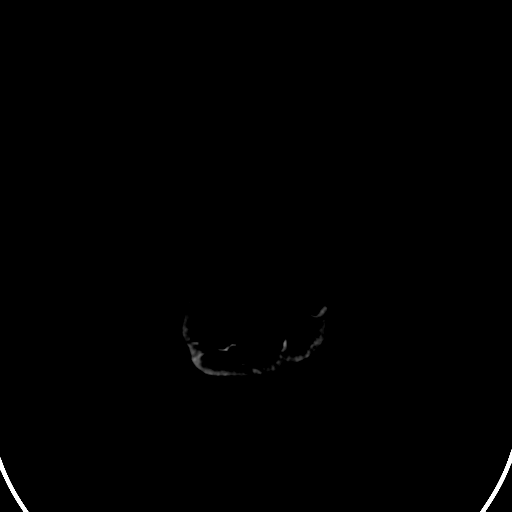

[16 of 30 positions shown; findings below may reference images not displayed]

FINDINGS: There is no evidence of acute intracranial hemorrhage, mass lesion,
brain edema or extra-axial fluid collection. The ventricles and
subarachnoid spaces are appropriately sized for age. There is no CT
evidence of acute cortical infarction. There is patchy low density
in the periventricular and subcortical white matter bilaterally.
There is a small low-density lesion in the left caudate head on
image 15.

The visualized paranasal sinuses, mastoid air cells and middle ears
are clear. The calvarium is intact.
IMPRESSION: No acute intracranial findings. Small vessel ischemic changes within
the periventricular and subcortical white matter. Possible small
lacunar infarct in the left caudate head, not likely to be acute.

## 2018-11-15 DIAGNOSIS — E118 Type 2 diabetes mellitus with unspecified complications: Secondary | ICD-10-CM | POA: Diagnosis not present

## 2018-11-15 DIAGNOSIS — R0789 Other chest pain: Secondary | ICD-10-CM | POA: Diagnosis not present

## 2018-11-15 DIAGNOSIS — I10 Essential (primary) hypertension: Secondary | ICD-10-CM | POA: Diagnosis not present

## 2018-11-15 DIAGNOSIS — J301 Allergic rhinitis due to pollen: Secondary | ICD-10-CM | POA: Diagnosis not present

## 2018-11-15 DIAGNOSIS — E782 Mixed hyperlipidemia: Secondary | ICD-10-CM | POA: Diagnosis not present

## 2018-11-15 DIAGNOSIS — G4733 Obstructive sleep apnea (adult) (pediatric): Secondary | ICD-10-CM | POA: Diagnosis not present

## 2019-02-15 DIAGNOSIS — K621 Rectal polyp: Secondary | ICD-10-CM | POA: Diagnosis not present

## 2019-02-15 DIAGNOSIS — D127 Benign neoplasm of rectosigmoid junction: Secondary | ICD-10-CM | POA: Diagnosis not present

## 2019-02-15 DIAGNOSIS — Z7984 Long term (current) use of oral hypoglycemic drugs: Secondary | ICD-10-CM | POA: Diagnosis not present

## 2019-02-15 DIAGNOSIS — R011 Cardiac murmur, unspecified: Secondary | ICD-10-CM | POA: Diagnosis not present

## 2019-02-15 DIAGNOSIS — G4730 Sleep apnea, unspecified: Secondary | ICD-10-CM | POA: Diagnosis not present

## 2019-02-15 DIAGNOSIS — E785 Hyperlipidemia, unspecified: Secondary | ICD-10-CM | POA: Diagnosis not present

## 2019-02-15 DIAGNOSIS — E119 Type 2 diabetes mellitus without complications: Secondary | ICD-10-CM | POA: Diagnosis not present

## 2019-02-15 DIAGNOSIS — Z79899 Other long term (current) drug therapy: Secondary | ICD-10-CM | POA: Diagnosis not present

## 2019-02-15 DIAGNOSIS — Z7982 Long term (current) use of aspirin: Secondary | ICD-10-CM | POA: Diagnosis not present

## 2019-02-15 DIAGNOSIS — Z1211 Encounter for screening for malignant neoplasm of colon: Secondary | ICD-10-CM | POA: Diagnosis not present

## 2019-02-15 DIAGNOSIS — I10 Essential (primary) hypertension: Secondary | ICD-10-CM | POA: Diagnosis not present

## 2019-02-15 DIAGNOSIS — Z87891 Personal history of nicotine dependence: Secondary | ICD-10-CM | POA: Diagnosis not present

## 2019-02-27 DIAGNOSIS — G4733 Obstructive sleep apnea (adult) (pediatric): Secondary | ICD-10-CM | POA: Diagnosis not present

## 2019-02-27 DIAGNOSIS — E782 Mixed hyperlipidemia: Secondary | ICD-10-CM | POA: Diagnosis not present

## 2019-02-27 DIAGNOSIS — Z1159 Encounter for screening for other viral diseases: Secondary | ICD-10-CM | POA: Diagnosis not present

## 2019-02-27 DIAGNOSIS — E1159 Type 2 diabetes mellitus with other circulatory complications: Secondary | ICD-10-CM | POA: Diagnosis not present

## 2019-02-27 DIAGNOSIS — E119 Type 2 diabetes mellitus without complications: Secondary | ICD-10-CM | POA: Diagnosis not present

## 2019-02-27 DIAGNOSIS — C189 Malignant neoplasm of colon, unspecified: Secondary | ICD-10-CM | POA: Diagnosis not present

## 2019-02-27 DIAGNOSIS — Z7984 Long term (current) use of oral hypoglycemic drugs: Secondary | ICD-10-CM | POA: Diagnosis not present

## 2019-02-27 DIAGNOSIS — J4520 Mild intermittent asthma, uncomplicated: Secondary | ICD-10-CM | POA: Diagnosis not present

## 2019-02-27 DIAGNOSIS — Z01812 Encounter for preprocedural laboratory examination: Secondary | ICD-10-CM | POA: Diagnosis not present

## 2019-02-27 DIAGNOSIS — I10 Essential (primary) hypertension: Secondary | ICD-10-CM | POA: Diagnosis not present

## 2019-02-27 DIAGNOSIS — E785 Hyperlipidemia, unspecified: Secondary | ICD-10-CM | POA: Diagnosis not present

## 2019-03-02 DIAGNOSIS — G4733 Obstructive sleep apnea (adult) (pediatric): Secondary | ICD-10-CM | POA: Diagnosis not present

## 2019-03-02 DIAGNOSIS — J4520 Mild intermittent asthma, uncomplicated: Secondary | ICD-10-CM | POA: Diagnosis not present

## 2019-03-02 DIAGNOSIS — K5669 Other partial intestinal obstruction: Secondary | ICD-10-CM | POA: Diagnosis not present

## 2019-03-02 DIAGNOSIS — E119 Type 2 diabetes mellitus without complications: Secondary | ICD-10-CM | POA: Diagnosis not present

## 2019-03-02 DIAGNOSIS — K219 Gastro-esophageal reflux disease without esophagitis: Secondary | ICD-10-CM | POA: Diagnosis not present

## 2019-03-02 DIAGNOSIS — C189 Malignant neoplasm of colon, unspecified: Secondary | ICD-10-CM | POA: Diagnosis not present

## 2019-03-02 DIAGNOSIS — I10 Essential (primary) hypertension: Secondary | ICD-10-CM | POA: Diagnosis not present

## 2019-03-02 DIAGNOSIS — Z5331 Laparoscopic surgical procedure converted to open procedure: Secondary | ICD-10-CM | POA: Diagnosis not present

## 2019-03-02 DIAGNOSIS — K4090 Unilateral inguinal hernia, without obstruction or gangrene, not specified as recurrent: Secondary | ICD-10-CM | POA: Diagnosis not present

## 2019-03-02 DIAGNOSIS — E785 Hyperlipidemia, unspecified: Secondary | ICD-10-CM | POA: Diagnosis not present

## 2019-03-05 DIAGNOSIS — I10 Essential (primary) hypertension: Secondary | ICD-10-CM | POA: Diagnosis not present

## 2019-03-05 DIAGNOSIS — K219 Gastro-esophageal reflux disease without esophagitis: Secondary | ICD-10-CM | POA: Diagnosis not present

## 2019-03-05 DIAGNOSIS — J4520 Mild intermittent asthma, uncomplicated: Secondary | ICD-10-CM | POA: Diagnosis not present

## 2019-03-05 DIAGNOSIS — G4733 Obstructive sleep apnea (adult) (pediatric): Secondary | ICD-10-CM | POA: Diagnosis not present

## 2019-03-05 DIAGNOSIS — E119 Type 2 diabetes mellitus without complications: Secondary | ICD-10-CM | POA: Diagnosis not present

## 2019-03-06 DIAGNOSIS — Z9049 Acquired absence of other specified parts of digestive tract: Secondary | ICD-10-CM | POA: Diagnosis not present

## 2019-03-07 DIAGNOSIS — E119 Type 2 diabetes mellitus without complications: Secondary | ICD-10-CM | POA: Diagnosis not present

## 2019-03-07 DIAGNOSIS — G4733 Obstructive sleep apnea (adult) (pediatric): Secondary | ICD-10-CM | POA: Diagnosis not present

## 2019-03-07 DIAGNOSIS — K219 Gastro-esophageal reflux disease without esophagitis: Secondary | ICD-10-CM | POA: Diagnosis not present

## 2019-03-07 DIAGNOSIS — C189 Malignant neoplasm of colon, unspecified: Secondary | ICD-10-CM | POA: Diagnosis not present

## 2019-03-07 DIAGNOSIS — Z9049 Acquired absence of other specified parts of digestive tract: Secondary | ICD-10-CM | POA: Diagnosis not present

## 2019-03-07 DIAGNOSIS — J4520 Mild intermittent asthma, uncomplicated: Secondary | ICD-10-CM | POA: Diagnosis not present

## 2019-03-08 DIAGNOSIS — G4733 Obstructive sleep apnea (adult) (pediatric): Secondary | ICD-10-CM | POA: Diagnosis not present

## 2019-03-08 DIAGNOSIS — Z9049 Acquired absence of other specified parts of digestive tract: Secondary | ICD-10-CM | POA: Diagnosis not present

## 2019-03-08 DIAGNOSIS — J4520 Mild intermittent asthma, uncomplicated: Secondary | ICD-10-CM | POA: Diagnosis not present

## 2019-03-08 DIAGNOSIS — E119 Type 2 diabetes mellitus without complications: Secondary | ICD-10-CM | POA: Diagnosis not present

## 2019-03-08 DIAGNOSIS — C189 Malignant neoplasm of colon, unspecified: Secondary | ICD-10-CM | POA: Diagnosis not present

## 2019-03-08 DIAGNOSIS — K219 Gastro-esophageal reflux disease without esophagitis: Secondary | ICD-10-CM | POA: Diagnosis not present

## 2019-03-09 DIAGNOSIS — Z9049 Acquired absence of other specified parts of digestive tract: Secondary | ICD-10-CM | POA: Diagnosis not present

## 2019-03-09 DIAGNOSIS — K219 Gastro-esophageal reflux disease without esophagitis: Secondary | ICD-10-CM | POA: Diagnosis not present

## 2019-03-09 DIAGNOSIS — C189 Malignant neoplasm of colon, unspecified: Secondary | ICD-10-CM | POA: Diagnosis not present

## 2019-03-09 DIAGNOSIS — G4733 Obstructive sleep apnea (adult) (pediatric): Secondary | ICD-10-CM | POA: Diagnosis not present

## 2019-03-09 DIAGNOSIS — E119 Type 2 diabetes mellitus without complications: Secondary | ICD-10-CM | POA: Diagnosis not present

## 2019-03-09 DIAGNOSIS — J4520 Mild intermittent asthma, uncomplicated: Secondary | ICD-10-CM | POA: Diagnosis not present

## 2019-03-10 DIAGNOSIS — Z9049 Acquired absence of other specified parts of digestive tract: Secondary | ICD-10-CM | POA: Diagnosis not present

## 2019-03-10 DIAGNOSIS — G4733 Obstructive sleep apnea (adult) (pediatric): Secondary | ICD-10-CM | POA: Diagnosis not present

## 2019-03-10 DIAGNOSIS — C189 Malignant neoplasm of colon, unspecified: Secondary | ICD-10-CM | POA: Diagnosis not present

## 2019-03-10 DIAGNOSIS — J4520 Mild intermittent asthma, uncomplicated: Secondary | ICD-10-CM | POA: Diagnosis not present

## 2019-03-10 DIAGNOSIS — K219 Gastro-esophageal reflux disease without esophagitis: Secondary | ICD-10-CM | POA: Diagnosis not present

## 2019-03-10 DIAGNOSIS — E119 Type 2 diabetes mellitus without complications: Secondary | ICD-10-CM | POA: Diagnosis not present

## 2019-03-29 DIAGNOSIS — K635 Polyp of colon: Secondary | ICD-10-CM | POA: Diagnosis not present

## 2019-03-29 DIAGNOSIS — Z7901 Long term (current) use of anticoagulants: Secondary | ICD-10-CM | POA: Diagnosis not present

## 2019-03-29 DIAGNOSIS — I480 Paroxysmal atrial fibrillation: Secondary | ICD-10-CM | POA: Diagnosis not present

## 2019-03-29 DIAGNOSIS — Z87891 Personal history of nicotine dependence: Secondary | ICD-10-CM | POA: Diagnosis not present

## 2019-03-29 DIAGNOSIS — R197 Diarrhea, unspecified: Secondary | ICD-10-CM | POA: Diagnosis not present

## 2019-03-29 DIAGNOSIS — D126 Benign neoplasm of colon, unspecified: Secondary | ICD-10-CM | POA: Diagnosis not present

## 2019-03-29 DIAGNOSIS — I10 Essential (primary) hypertension: Secondary | ICD-10-CM | POA: Diagnosis not present

## 2019-03-29 DIAGNOSIS — D649 Anemia, unspecified: Secondary | ICD-10-CM | POA: Diagnosis not present

## 2019-03-29 DIAGNOSIS — C189 Malignant neoplasm of colon, unspecified: Secondary | ICD-10-CM | POA: Diagnosis not present

## 2019-03-29 DIAGNOSIS — C183 Malignant neoplasm of hepatic flexure: Secondary | ICD-10-CM | POA: Diagnosis not present

## 2019-03-29 DIAGNOSIS — D500 Iron deficiency anemia secondary to blood loss (chronic): Secondary | ICD-10-CM | POA: Diagnosis not present

## 2019-03-29 DIAGNOSIS — Z560 Unemployment, unspecified: Secondary | ICD-10-CM | POA: Diagnosis not present

## 2019-03-29 DIAGNOSIS — Z9049 Acquired absence of other specified parts of digestive tract: Secondary | ICD-10-CM | POA: Diagnosis not present

## 2019-07-26 DIAGNOSIS — I10 Essential (primary) hypertension: Secondary | ICD-10-CM | POA: Diagnosis not present

## 2019-07-26 DIAGNOSIS — E1159 Type 2 diabetes mellitus with other circulatory complications: Secondary | ICD-10-CM | POA: Diagnosis not present

## 2019-07-26 DIAGNOSIS — E782 Mixed hyperlipidemia: Secondary | ICD-10-CM | POA: Diagnosis not present

## 2019-07-26 DIAGNOSIS — R600 Localized edema: Secondary | ICD-10-CM | POA: Diagnosis not present

## 2019-07-26 DIAGNOSIS — Z029 Encounter for administrative examinations, unspecified: Secondary | ICD-10-CM | POA: Diagnosis not present

## 2019-08-09 DIAGNOSIS — C189 Malignant neoplasm of colon, unspecified: Secondary | ICD-10-CM | POA: Diagnosis not present

## 2019-08-09 DIAGNOSIS — R197 Diarrhea, unspecified: Secondary | ICD-10-CM | POA: Diagnosis not present

## 2019-08-10 DIAGNOSIS — K625 Hemorrhage of anus and rectum: Secondary | ICD-10-CM | POA: Diagnosis not present

## 2019-08-10 DIAGNOSIS — E1137X1 Type 2 diabetes mellitus with diabetic macular edema, resolved following treatment, right eye: Secondary | ICD-10-CM | POA: Diagnosis not present

## 2019-08-10 DIAGNOSIS — R197 Diarrhea, unspecified: Secondary | ICD-10-CM | POA: Diagnosis not present

## 2019-10-04 DIAGNOSIS — I10 Essential (primary) hypertension: Secondary | ICD-10-CM | POA: Diagnosis not present

## 2019-10-04 DIAGNOSIS — R197 Diarrhea, unspecified: Secondary | ICD-10-CM | POA: Diagnosis not present

## 2019-10-04 DIAGNOSIS — D500 Iron deficiency anemia secondary to blood loss (chronic): Secondary | ICD-10-CM | POA: Diagnosis not present

## 2019-10-04 DIAGNOSIS — Z7984 Long term (current) use of oral hypoglycemic drugs: Secondary | ICD-10-CM | POA: Diagnosis not present

## 2019-10-04 DIAGNOSIS — C189 Malignant neoplasm of colon, unspecified: Secondary | ICD-10-CM | POA: Diagnosis not present

## 2019-10-04 DIAGNOSIS — E119 Type 2 diabetes mellitus without complications: Secondary | ICD-10-CM | POA: Diagnosis not present

## 2019-10-04 DIAGNOSIS — I4891 Unspecified atrial fibrillation: Secondary | ICD-10-CM | POA: Diagnosis not present

## 2019-10-04 DIAGNOSIS — Z9049 Acquired absence of other specified parts of digestive tract: Secondary | ICD-10-CM | POA: Diagnosis not present

## 2019-10-04 DIAGNOSIS — Z87891 Personal history of nicotine dependence: Secondary | ICD-10-CM | POA: Diagnosis not present

## 2019-11-01 DIAGNOSIS — D649 Anemia, unspecified: Secondary | ICD-10-CM | POA: Diagnosis not present

## 2019-11-01 DIAGNOSIS — Z113 Encounter for screening for infections with a predominantly sexual mode of transmission: Secondary | ICD-10-CM | POA: Diagnosis not present

## 2019-11-01 DIAGNOSIS — N189 Chronic kidney disease, unspecified: Secondary | ICD-10-CM | POA: Diagnosis not present

## 2019-11-01 DIAGNOSIS — E1159 Type 2 diabetes mellitus with other circulatory complications: Secondary | ICD-10-CM | POA: Diagnosis not present

## 2019-11-09 DIAGNOSIS — H25813 Combined forms of age-related cataract, bilateral: Secondary | ICD-10-CM | POA: Diagnosis not present

## 2019-11-09 DIAGNOSIS — E119 Type 2 diabetes mellitus without complications: Secondary | ICD-10-CM | POA: Diagnosis not present

## 2020-01-19 DIAGNOSIS — J45909 Unspecified asthma, uncomplicated: Secondary | ICD-10-CM | POA: Diagnosis not present

## 2020-01-19 DIAGNOSIS — R0609 Other forms of dyspnea: Secondary | ICD-10-CM | POA: Diagnosis not present

## 2020-01-19 DIAGNOSIS — J984 Other disorders of lung: Secondary | ICD-10-CM | POA: Diagnosis not present

## 2020-01-19 DIAGNOSIS — R0602 Shortness of breath: Secondary | ICD-10-CM | POA: Diagnosis not present

## 2020-01-19 DIAGNOSIS — I2699 Other pulmonary embolism without acute cor pulmonale: Secondary | ICD-10-CM | POA: Diagnosis not present

## 2020-01-19 DIAGNOSIS — N62 Hypertrophy of breast: Secondary | ICD-10-CM | POA: Diagnosis not present

## 2020-01-24 DIAGNOSIS — Z85038 Personal history of other malignant neoplasm of large intestine: Secondary | ICD-10-CM | POA: Diagnosis not present

## 2020-02-02 DIAGNOSIS — H25812 Combined forms of age-related cataract, left eye: Secondary | ICD-10-CM | POA: Diagnosis not present

## 2020-02-02 DIAGNOSIS — E119 Type 2 diabetes mellitus without complications: Secondary | ICD-10-CM | POA: Diagnosis not present

## 2020-02-02 DIAGNOSIS — H04123 Dry eye syndrome of bilateral lacrimal glands: Secondary | ICD-10-CM | POA: Diagnosis not present

## 2020-02-02 DIAGNOSIS — H25813 Combined forms of age-related cataract, bilateral: Secondary | ICD-10-CM | POA: Diagnosis not present

## 2020-02-08 DIAGNOSIS — B86 Scabies: Secondary | ICD-10-CM | POA: Diagnosis not present

## 2020-02-29 DIAGNOSIS — B86 Scabies: Secondary | ICD-10-CM | POA: Diagnosis not present

## 2020-03-28 DIAGNOSIS — E1136 Type 2 diabetes mellitus with diabetic cataract: Secondary | ICD-10-CM | POA: Diagnosis not present

## 2020-03-28 DIAGNOSIS — I10 Essential (primary) hypertension: Secondary | ICD-10-CM | POA: Diagnosis not present

## 2020-03-28 DIAGNOSIS — Z87891 Personal history of nicotine dependence: Secondary | ICD-10-CM | POA: Diagnosis not present

## 2020-03-28 DIAGNOSIS — E785 Hyperlipidemia, unspecified: Secondary | ICD-10-CM | POA: Diagnosis not present

## 2020-03-28 DIAGNOSIS — Z6841 Body Mass Index (BMI) 40.0 and over, adult: Secondary | ICD-10-CM | POA: Diagnosis not present

## 2020-03-28 DIAGNOSIS — K219 Gastro-esophageal reflux disease without esophagitis: Secondary | ICD-10-CM | POA: Diagnosis not present

## 2020-03-28 DIAGNOSIS — J45909 Unspecified asthma, uncomplicated: Secondary | ICD-10-CM | POA: Diagnosis not present

## 2020-03-28 DIAGNOSIS — G4730 Sleep apnea, unspecified: Secondary | ICD-10-CM | POA: Diagnosis not present

## 2020-03-28 DIAGNOSIS — H2512 Age-related nuclear cataract, left eye: Secondary | ICD-10-CM | POA: Diagnosis not present

## 2020-04-02 DIAGNOSIS — C189 Malignant neoplasm of colon, unspecified: Secondary | ICD-10-CM | POA: Diagnosis not present

## 2020-04-26 DIAGNOSIS — R197 Diarrhea, unspecified: Secondary | ICD-10-CM | POA: Diagnosis not present

## 2020-04-26 DIAGNOSIS — D509 Iron deficiency anemia, unspecified: Secondary | ICD-10-CM | POA: Diagnosis not present

## 2020-04-26 DIAGNOSIS — C189 Malignant neoplasm of colon, unspecified: Secondary | ICD-10-CM | POA: Diagnosis not present

## 2020-04-26 DIAGNOSIS — N289 Disorder of kidney and ureter, unspecified: Secondary | ICD-10-CM | POA: Diagnosis not present

## 2020-04-26 DIAGNOSIS — I480 Paroxysmal atrial fibrillation: Secondary | ICD-10-CM | POA: Diagnosis not present

## 2020-04-26 DIAGNOSIS — Z9049 Acquired absence of other specified parts of digestive tract: Secondary | ICD-10-CM | POA: Diagnosis not present

## 2020-05-18 DIAGNOSIS — L989 Disorder of the skin and subcutaneous tissue, unspecified: Secondary | ICD-10-CM | POA: Diagnosis not present

## 2020-05-23 DIAGNOSIS — I10 Essential (primary) hypertension: Secondary | ICD-10-CM | POA: Diagnosis not present

## 2020-05-23 DIAGNOSIS — Z1339 Encounter for screening examination for other mental health and behavioral disorders: Secondary | ICD-10-CM | POA: Diagnosis not present

## 2020-05-23 DIAGNOSIS — H269 Unspecified cataract: Secondary | ICD-10-CM | POA: Diagnosis not present

## 2020-05-23 DIAGNOSIS — Z6841 Body Mass Index (BMI) 40.0 and over, adult: Secondary | ICD-10-CM | POA: Diagnosis not present

## 2020-05-23 DIAGNOSIS — R0609 Other forms of dyspnea: Secondary | ICD-10-CM | POA: Diagnosis not present

## 2020-05-23 DIAGNOSIS — G4733 Obstructive sleep apnea (adult) (pediatric): Secondary | ICD-10-CM | POA: Diagnosis not present

## 2020-05-23 DIAGNOSIS — Z0000 Encounter for general adult medical examination without abnormal findings: Secondary | ICD-10-CM | POA: Diagnosis not present

## 2020-05-23 DIAGNOSIS — K219 Gastro-esophageal reflux disease without esophagitis: Secondary | ICD-10-CM | POA: Diagnosis not present

## 2020-05-23 DIAGNOSIS — C189 Malignant neoplasm of colon, unspecified: Secondary | ICD-10-CM | POA: Diagnosis not present

## 2020-05-23 DIAGNOSIS — E782 Mixed hyperlipidemia: Secondary | ICD-10-CM | POA: Diagnosis not present

## 2020-05-23 DIAGNOSIS — E1159 Type 2 diabetes mellitus with other circulatory complications: Secondary | ICD-10-CM | POA: Diagnosis not present

## 2020-06-06 DIAGNOSIS — Z23 Encounter for immunization: Secondary | ICD-10-CM | POA: Diagnosis not present

## 2020-06-06 DIAGNOSIS — E119 Type 2 diabetes mellitus without complications: Secondary | ICD-10-CM | POA: Diagnosis not present

## 2020-06-06 DIAGNOSIS — R0600 Dyspnea, unspecified: Secondary | ICD-10-CM | POA: Diagnosis not present

## 2020-06-06 DIAGNOSIS — E785 Hyperlipidemia, unspecified: Secondary | ICD-10-CM | POA: Diagnosis not present

## 2020-07-11 DIAGNOSIS — I10 Essential (primary) hypertension: Secondary | ICD-10-CM | POA: Diagnosis not present

## 2020-07-11 DIAGNOSIS — R9431 Abnormal electrocardiogram [ECG] [EKG]: Secondary | ICD-10-CM | POA: Diagnosis not present

## 2020-08-06 DIAGNOSIS — I059 Rheumatic mitral valve disease, unspecified: Secondary | ICD-10-CM | POA: Diagnosis not present

## 2020-09-16 DIAGNOSIS — I10 Essential (primary) hypertension: Secondary | ICD-10-CM | POA: Diagnosis not present

## 2020-09-16 DIAGNOSIS — E119 Type 2 diabetes mellitus without complications: Secondary | ICD-10-CM | POA: Diagnosis not present

## 2020-10-11 DIAGNOSIS — Z87891 Personal history of nicotine dependence: Secondary | ICD-10-CM | POA: Diagnosis not present

## 2020-10-11 DIAGNOSIS — J4530 Mild persistent asthma, uncomplicated: Secondary | ICD-10-CM | POA: Diagnosis not present

## 2020-10-11 DIAGNOSIS — G4733 Obstructive sleep apnea (adult) (pediatric): Secondary | ICD-10-CM | POA: Diagnosis not present

## 2020-10-11 DIAGNOSIS — Z6841 Body Mass Index (BMI) 40.0 and over, adult: Secondary | ICD-10-CM | POA: Diagnosis not present

## 2020-10-29 DIAGNOSIS — G4733 Obstructive sleep apnea (adult) (pediatric): Secondary | ICD-10-CM | POA: Diagnosis not present

## 2020-10-29 DIAGNOSIS — T18128A Food in esophagus causing other injury, initial encounter: Secondary | ICD-10-CM | POA: Diagnosis not present

## 2020-10-29 DIAGNOSIS — K222 Esophageal obstruction: Secondary | ICD-10-CM | POA: Diagnosis not present

## 2020-10-29 DIAGNOSIS — N182 Chronic kidney disease, stage 2 (mild): Secondary | ICD-10-CM | POA: Diagnosis not present

## 2020-10-29 DIAGNOSIS — E870 Hyperosmolality and hypernatremia: Secondary | ICD-10-CM | POA: Diagnosis not present

## 2020-10-29 DIAGNOSIS — T18108A Unspecified foreign body in esophagus causing other injury, initial encounter: Secondary | ICD-10-CM | POA: Diagnosis not present

## 2020-10-29 DIAGNOSIS — E1122 Type 2 diabetes mellitus with diabetic chronic kidney disease: Secondary | ICD-10-CM | POA: Diagnosis not present

## 2020-10-30 DIAGNOSIS — K3189 Other diseases of stomach and duodenum: Secondary | ICD-10-CM | POA: Diagnosis not present

## 2020-10-30 DIAGNOSIS — T18128A Food in esophagus causing other injury, initial encounter: Secondary | ICD-10-CM | POA: Diagnosis not present

## 2020-11-07 DIAGNOSIS — N182 Chronic kidney disease, stage 2 (mild): Secondary | ICD-10-CM | POA: Diagnosis not present

## 2020-11-07 DIAGNOSIS — C189 Malignant neoplasm of colon, unspecified: Secondary | ICD-10-CM | POA: Diagnosis not present

## 2020-11-07 DIAGNOSIS — Z9049 Acquired absence of other specified parts of digestive tract: Secondary | ICD-10-CM | POA: Diagnosis not present

## 2020-11-07 DIAGNOSIS — D509 Iron deficiency anemia, unspecified: Secondary | ICD-10-CM | POA: Diagnosis not present

## 2020-11-07 DIAGNOSIS — M549 Dorsalgia, unspecified: Secondary | ICD-10-CM | POA: Diagnosis not present

## 2021-01-20 DIAGNOSIS — Z85038 Personal history of other malignant neoplasm of large intestine: Secondary | ICD-10-CM | POA: Diagnosis not present

## 2021-01-20 DIAGNOSIS — K644 Residual hemorrhoidal skin tags: Secondary | ICD-10-CM | POA: Diagnosis not present

## 2021-01-20 DIAGNOSIS — Z86010 Personal history of colonic polyps: Secondary | ICD-10-CM | POA: Diagnosis not present
